# Patient Record
Sex: Male | Born: 1955 | Race: White | Hispanic: No | Marital: Married
Health system: Southern US, Community
[De-identification: ages and names within clinical notes are randomized; demographics above are authoritative.]

---

## 2005-02-13 ENCOUNTER — Other Ambulatory Visit: Payer: Self-pay

## 2005-02-13 ENCOUNTER — Emergency Department: Payer: Self-pay | Admitting: Emergency Medicine

## 2005-02-15 ENCOUNTER — Ambulatory Visit: Payer: Self-pay | Admitting: Emergency Medicine

## 2005-02-23 ENCOUNTER — Inpatient Hospital Stay: Payer: Self-pay | Admitting: Surgery

## 2014-03-09 ENCOUNTER — Emergency Department: Payer: Self-pay | Admitting: Internal Medicine

## 2015-11-24 ENCOUNTER — Other Ambulatory Visit
Admission: RE | Admit: 2015-11-24 | Discharge: 2015-11-24 | Disposition: A | Discharge status: Home / Self Care | Payer: Self-pay | Source: Ambulatory Visit | Attending: Internal Medicine | Admitting: Internal Medicine

## 2015-11-24 DIAGNOSIS — R569 Unspecified convulsions: Secondary | ICD-10-CM | POA: Insufficient documentation

## 2015-11-24 LAB — PHENYTOIN LEVEL, TOTAL: Phenytoin Lvl: 21.4 ug/mL — ABNORMAL HIGH (ref 10.0–20.0)

## 2016-12-09 ENCOUNTER — Other Ambulatory Visit: Payer: Self-pay | Admitting: Internal Medicine

## 2016-12-09 ENCOUNTER — Other Ambulatory Visit (HOSPITAL_COMMUNITY): Payer: Self-pay | Admitting: Internal Medicine

## 2016-12-13 ENCOUNTER — Other Ambulatory Visit: Payer: Self-pay | Admitting: Internal Medicine

## 2016-12-13 DIAGNOSIS — R3129 Other microscopic hematuria: Secondary | ICD-10-CM

## 2016-12-20 ENCOUNTER — Ambulatory Visit
Admission: RE | Admit: 2016-12-20 | Discharge: 2016-12-20 | Disposition: A | Discharge status: Home / Self Care | Payer: BC Managed Care – PPO | Source: Ambulatory Visit | Attending: Internal Medicine | Admitting: Internal Medicine

## 2016-12-20 DIAGNOSIS — K5730 Diverticulosis of large intestine without perforation or abscess without bleeding: Secondary | ICD-10-CM | POA: Diagnosis not present

## 2016-12-20 DIAGNOSIS — I2510 Atherosclerotic heart disease of native coronary artery without angina pectoris: Secondary | ICD-10-CM | POA: Diagnosis not present

## 2016-12-20 DIAGNOSIS — I2584 Coronary atherosclerosis due to calcified coronary lesion: Secondary | ICD-10-CM | POA: Insufficient documentation

## 2016-12-20 DIAGNOSIS — I700 Atherosclerosis of aorta: Secondary | ICD-10-CM | POA: Insufficient documentation

## 2016-12-20 DIAGNOSIS — R3129 Other microscopic hematuria: Secondary | ICD-10-CM | POA: Diagnosis present

## 2018-10-14 IMAGING — CT CT RENAL STONE PROTOCOL
2 of 4 series · 16 of 46 positions shown, 18 images · non-contrast
Comparison: No comparison CT.

CLINICAL DATA: 60-year-old male with microhematuria. No complaints
of pain, nausea or vomiting. Prior cholecystectomy. Initial
encounter.

EXAM:
CT ABDOMEN AND PELVIS WITHOUT CONTRAST
TECHNIQUE: Multidetector CT imaging of the abdomen and pelvis was performed
following the standard protocol without IV contrast.

[Series 2: soft tissue · axial · 0.74mm/px · z∈[-864,-434]mm · 13 of 94 slices shown, 15 images]
[im 4/94  soft-tissue]
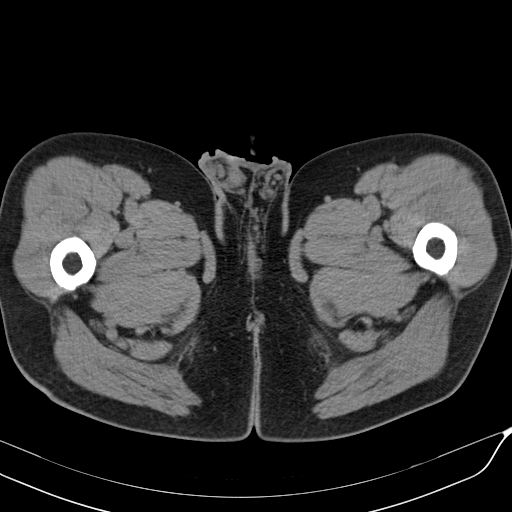
[im 4/94  bone]
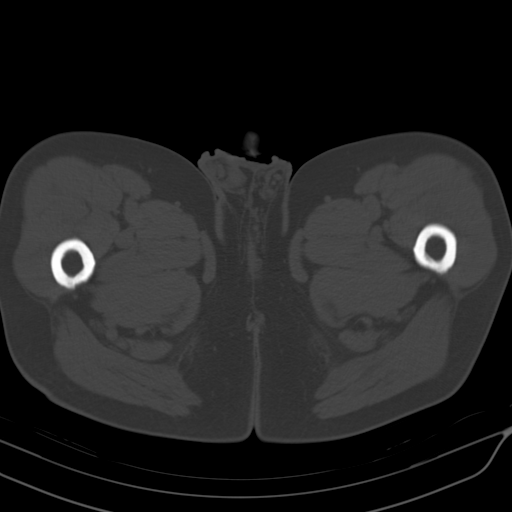
[im 12/94  soft-tissue]
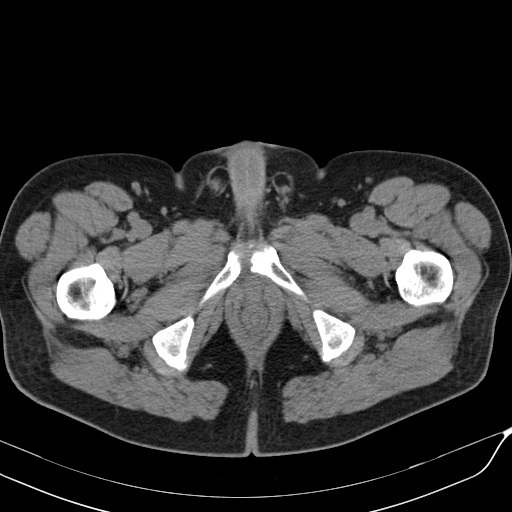
[im 20/94  soft-tissue]
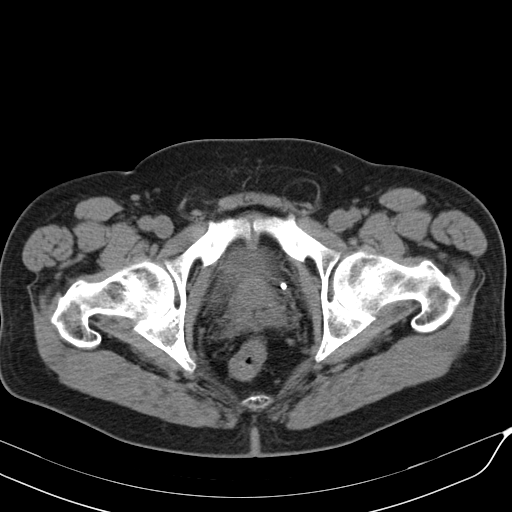
[im 28/94  soft-tissue]
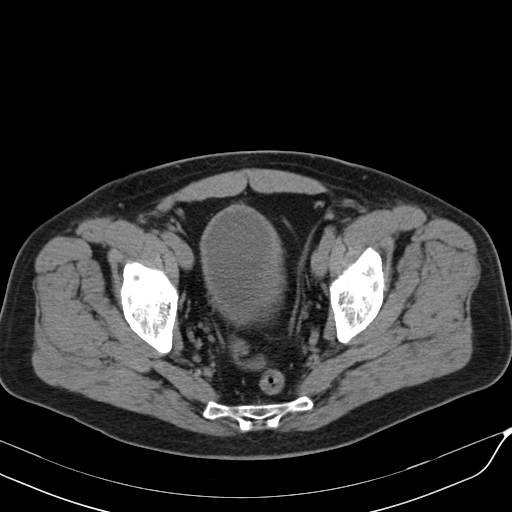
[im 32/94  soft-tissue]
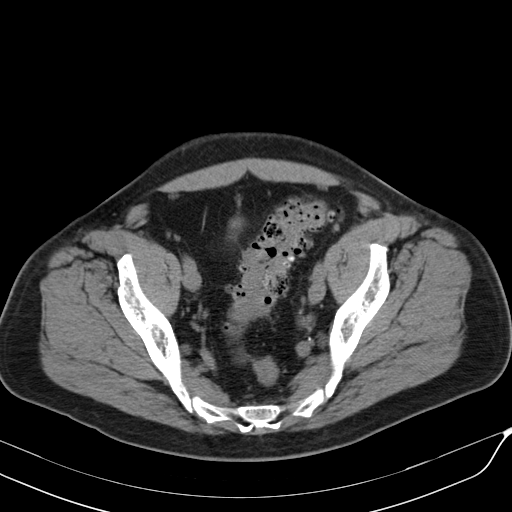
[im 39/94  soft-tissue]
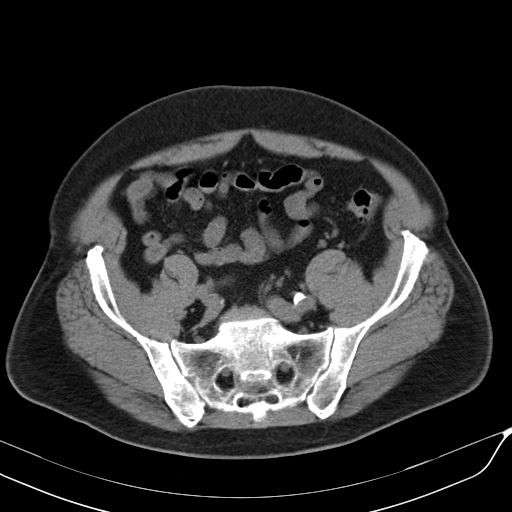
[im 47/94  soft-tissue]
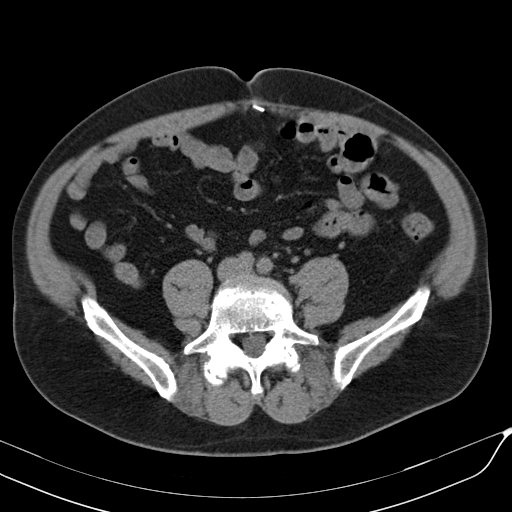
[im 55/94  soft-tissue]
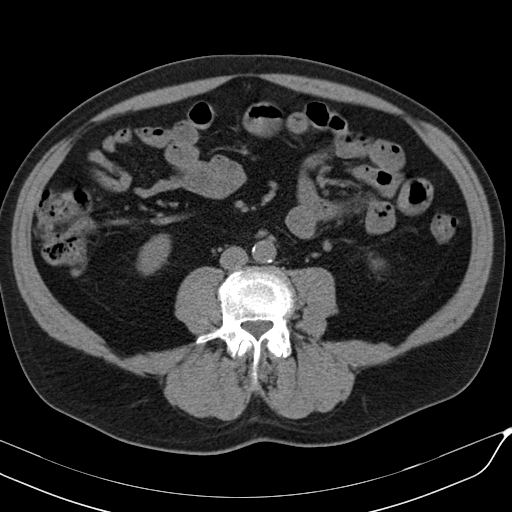
[im 63/94  soft-tissue]
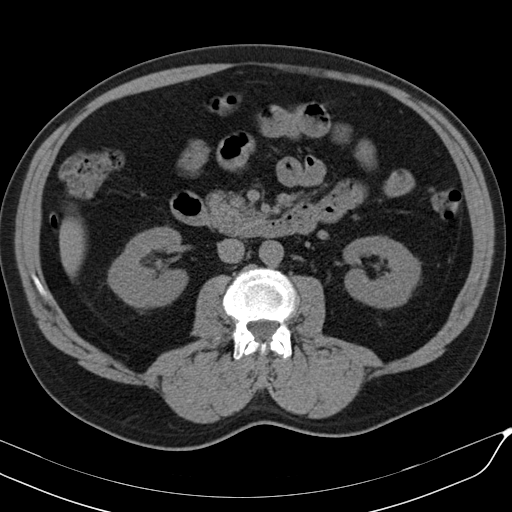
[im 63/94  bone]
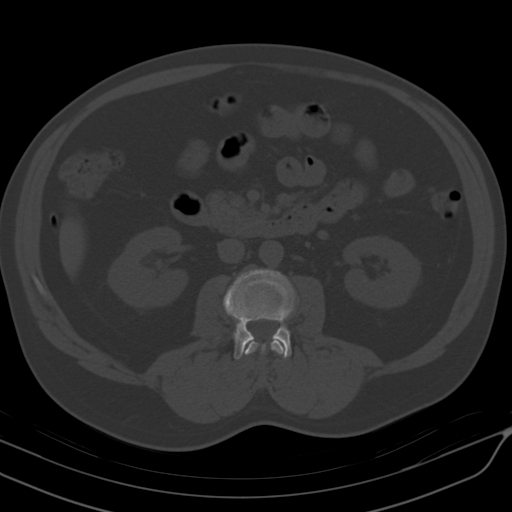
[im 66/94  soft-tissue]
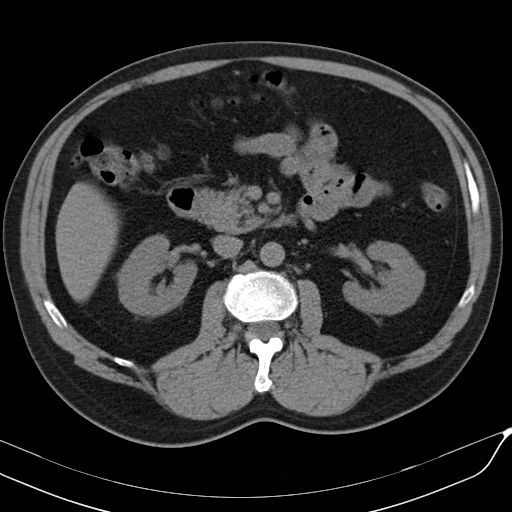
[im 74/94  soft-tissue]
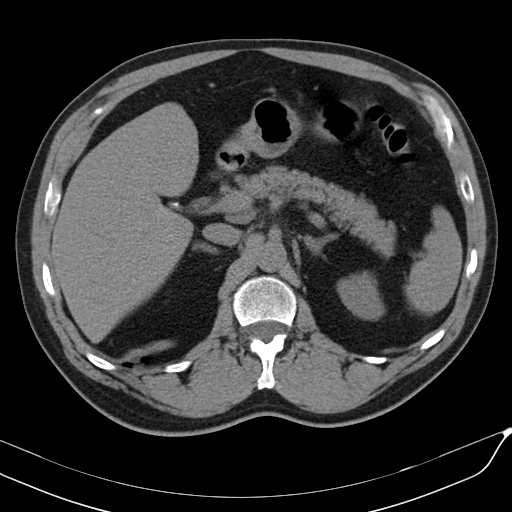
[im 82/94  soft-tissue]
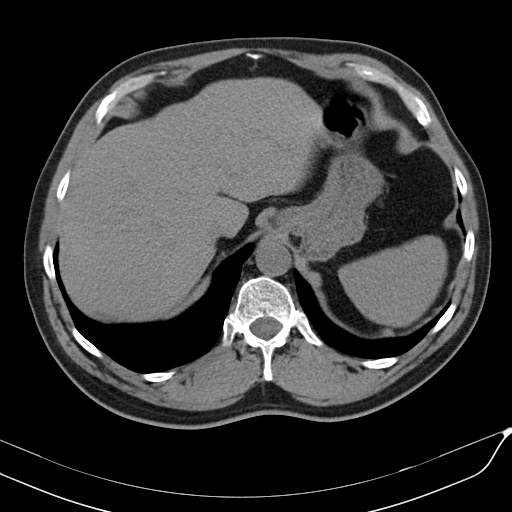
[im 90/94  soft-tissue]
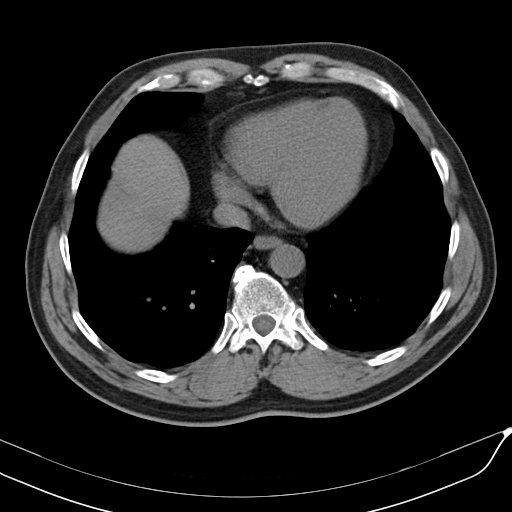

[Series 602: coronal · coronal · 0.92mm/px · 3 of 136 slices shown]
[im 46/136  soft-tissue]
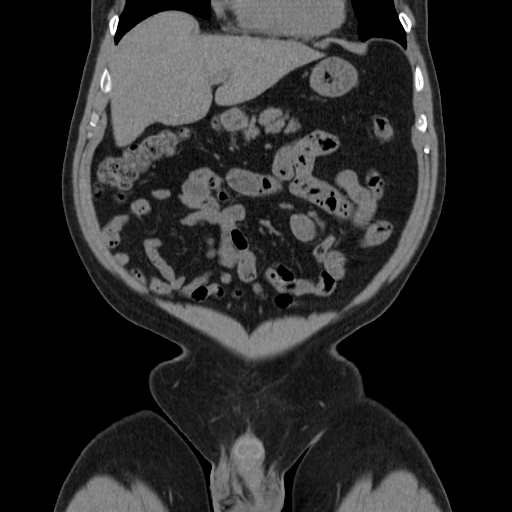
[im 61/136  soft-tissue]
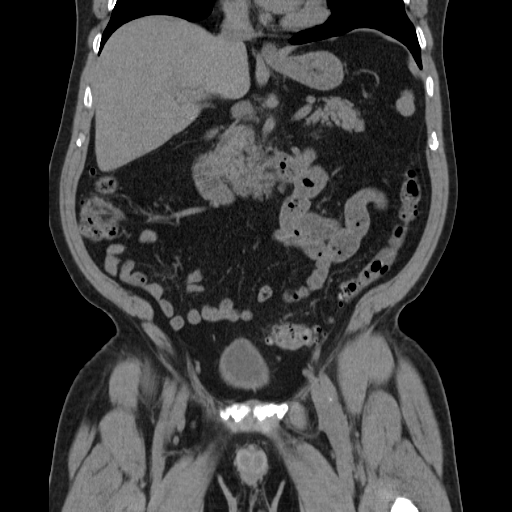
[im 76/136  soft-tissue]
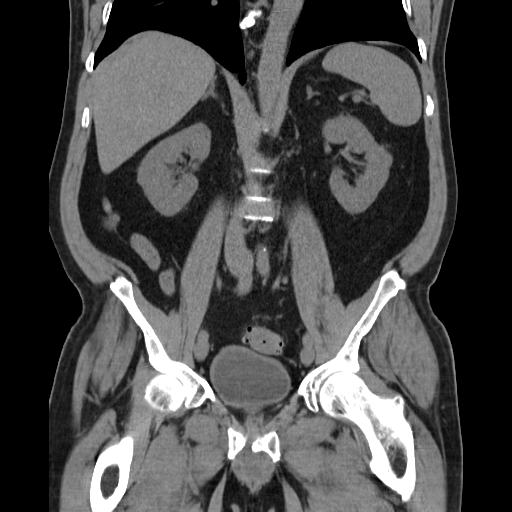

[16 of 46 positions shown; findings below may reference images not displayed]

FINDINGS: Lower chest: Tiny calcified granuloma inferior right middle lobe.
Right coronary artery calcification. Heart size within normal
limits.

Hepatobiliary: Slightly lobulated contour without findings of
cirrhosis. Taking into account limitation by non contrast imaging,
no worrisome mass. Post cholecystectomy.

Pancreas: Taking into account limitation by non contrast imaging, no
worrisome mass or inflammation.

Spleen: Taking into account limitation by non contrast imaging, no
mass or enlargement.

Adrenals/Urinary Tract: No obstructing stone or hydronephrosis.
Taking into account limitation by non contrast imaging, no renal or
adrenal mass.

Noncontrast filled views of the urinary bladder without mass
identified.

Stomach/Bowel: Prominent sigmoid diverticula with muscular
hypertrophy. Slight engorgement surrounding vessels but without
definitive findings of diverticulitis.

No inflammation surrounds the appendix.

No small bowel or gastric abnormality noted.

Vascular/Lymphatic: Atherosclerotic changes aorta and aortic branch
vessels without aortic aneurysm. No adenopathy.

Reproductive: No obvious abnormality.

Other: Fat containing inguinal hernias. No bowel containing hernia.
Surgical clip adjacent to the umbilicus.

Musculoskeletal: Mild degenerative changes lumbar spine without
osseous destructive lesion.
IMPRESSION: No renal or ureteral obstructing stone or hydronephrosis. Taking
into account limitation by non contrast imaging, no renal mass or
urinary bladder abnormality noted.

Prominent sigmoid diverticulosis with significant muscular
hypertrophy and vascular engorgement but without definitive findings
of diverticulitis.

Aortic Atherosclerosis ([id number]).

Right coronary artery calcification.

## 2019-05-16 ENCOUNTER — Ambulatory Visit: Payer: BC Managed Care – PPO | Attending: Internal Medicine

## 2019-05-16 DIAGNOSIS — Z23 Encounter for immunization: Secondary | ICD-10-CM

## 2019-05-16 NOTE — Progress Notes (Signed)
   Covid-[facility] Vaccination [facility]  Name:  Michael Monroe    MRN: [id number] DOB: May 25, 1955  05/16/2019  [name] was observed post Covid-19 immunization for 15 minutes without incident. He was provided with Vaccine Information Sheet and instruction to access the V-Safe system.   [name] was instructed to call 911 with any severe reactions post vaccine: Marland Kitchen Difficulty breathing  . Swelling of face and throat  . A fast heartbeat  . A bad rash all over body  . Dizziness and weakness   Immunizations Administered    Name Date Dose VIS Date Route   Pfizer COVID-19 Vaccine 05/16/2019  9:11 AM 0.3 mL 01/25/2019 Intramuscular   Manufacturer: [employer], [employer]   Lot: [id number]   NDC: [address]-0

## 2019-06-11 ENCOUNTER — Ambulatory Visit: Payer: BC Managed Care – PPO | Attending: Internal Medicine

## 2019-06-11 DIAGNOSIS — Z23 Encounter for immunization: Secondary | ICD-10-CM

## 2019-06-11 NOTE — Progress Notes (Signed)
   Covid-19 Vaccination Clinic  Name:  Michael Monroe    MRN: [id number] DOB: [birth year]  06/11/2019  [name] was observed post Covid-19 immunization for 15 minutes without incident. He was provided with Vaccine Information Sheet and instruction to access the V-Safe system.   [name] was instructed to call 911 with any severe reactions post vaccine: Marland Kitchen Difficulty breathing  . Swelling of face and throat  . A fast heartbeat  . A bad rash all over body  . Dizziness and weakness   Immunizations Administered    Name Date Dose VIS Date Route   Pfizer COVID-19 Vaccine 06/11/2019  9:01 AM 0.3 mL 04/10/2018 Intramuscular   Manufacturer: [employer], [employer]   Lot: [id number]   NDC: [address]-2
